# Patient Record
Sex: Female | Born: 1995 | Race: White | Hispanic: No | Marital: Single | Smoking: Never smoker
Health system: Southern US, Community
[De-identification: ages and names within clinical notes are randomized; demographics above are authoritative.]

## PROBLEM LIST (undated history)

## (undated) DIAGNOSIS — B2790 Infectious mononucleosis, unspecified without complication: Secondary | ICD-10-CM

## (undated) DIAGNOSIS — H53143 Visual discomfort, bilateral: Secondary | ICD-10-CM

## (undated) HISTORY — DX: Visual discomfort, bilateral: H53.143

---
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
## 2013-01-10 DIAGNOSIS — B2790 Infectious mononucleosis, unspecified without complication: Secondary | ICD-10-CM | POA: Insufficient documentation

## 2013-01-10 DIAGNOSIS — E559 Vitamin D deficiency, unspecified: Secondary | ICD-10-CM | POA: Insufficient documentation

## 2013-01-10 DIAGNOSIS — E7212 Methylenetetrahydrofolate reductase deficiency: Secondary | ICD-10-CM | POA: Insufficient documentation

## 2013-01-24 LAB — HEMOGLOBIN A1C: Hgb A1c MFr Bld: 4.8 % (ref 4.0–6.0)

## 2013-01-24 LAB — BASIC METABOLIC PANEL: Glucose: 80 mg/dL

## 2013-03-29 ENCOUNTER — Encounter (HOSPITAL_COMMUNITY): Payer: Self-pay | Admitting: *Deleted

## 2013-03-29 ENCOUNTER — Emergency Department (HOSPITAL_COMMUNITY)
Admission: EM | Admit: 2013-03-29 | Discharge: 2013-03-29 | Disposition: A | Discharge status: Home / Self Care | Payer: Managed Care, Other (non HMO) | Attending: Emergency Medicine | Admitting: Emergency Medicine

## 2013-03-29 ENCOUNTER — Emergency Department (HOSPITAL_COMMUNITY): Payer: Managed Care, Other (non HMO)

## 2013-03-29 DIAGNOSIS — Z3202 Encounter for pregnancy test, result negative: Secondary | ICD-10-CM | POA: Insufficient documentation

## 2013-03-29 DIAGNOSIS — R5381 Other malaise: Secondary | ICD-10-CM | POA: Insufficient documentation

## 2013-03-29 DIAGNOSIS — Z79899 Other long term (current) drug therapy: Secondary | ICD-10-CM | POA: Insufficient documentation

## 2013-03-29 DIAGNOSIS — R112 Nausea with vomiting, unspecified: Secondary | ICD-10-CM | POA: Insufficient documentation

## 2013-03-29 DIAGNOSIS — E039 Hypothyroidism, unspecified: Secondary | ICD-10-CM | POA: Insufficient documentation

## 2013-03-29 DIAGNOSIS — Z8619 Personal history of other infectious and parasitic diseases: Secondary | ICD-10-CM | POA: Insufficient documentation

## 2013-03-29 DIAGNOSIS — J029 Acute pharyngitis, unspecified: Secondary | ICD-10-CM | POA: Insufficient documentation

## 2013-03-29 DIAGNOSIS — R509 Fever, unspecified: Secondary | ICD-10-CM | POA: Insufficient documentation

## 2013-03-29 DIAGNOSIS — R000 Tachycardia, unspecified: Secondary | ICD-10-CM | POA: Insufficient documentation

## 2013-03-29 HISTORY — DX: Infectious mononucleosis, unspecified without complication: B27.90

## 2013-03-29 LAB — BASIC METABOLIC PANEL
Chloride: 101 mEq/L (ref 96–112)
Creatinine, Ser: 0.65 mg/dL (ref 0.50–1.10)
GFR calc Af Amer: >90 mL/min (ref >90)
GFR calc non Af Amer: >90 mL/min (ref >90)
Sodium: 136 mEq/L (ref 135–145)

## 2013-03-29 LAB — CBC WITH DIFFERENTIAL/PLATELET
Eosinophils Absolute: 0 10*3/uL (ref 0.0–0.7)
Eosinophils Relative: 0 % (ref 0–5)
Hemoglobin: 12.6 g/dL (ref 12.0–15.0)
Lymphs Abs: 0.6 10*3/uL — ABNORMAL LOW (ref 0.7–4.0)
MCH: 31.4 pg (ref 26.0–34.0)
MCHC: 35.3 g/dL (ref 30.0–36.0)
MCV: 89 fL (ref 78.0–100.0)
Neutrophils Relative %: 90 % — ABNORMAL HIGH (ref 43–77)
RBC: 4.01 MIL/uL (ref 3.87–5.11)
RDW: 12.9 % (ref 11.5–15.5)

## 2013-03-29 LAB — WET PREP, GENITAL: Clue Cells Wet Prep HPF POC: NONE SEEN

## 2013-03-29 MED ORDER — ONDANSETRON HCL 4 MG/2ML IJ SOLN
4.0000 mg | Freq: Once | INTRAMUSCULAR | Status: AC
  Administered 2013-03-29: 4 mg via INTRAVENOUS
  Filled 2013-03-29: qty 2

## 2013-03-29 MED ORDER — ONDANSETRON 4 MG PO TBDP: 4.0000 mg | ORAL_TABLET | Freq: Four times a day (QID) | ORAL | Status: DC | PRN

## 2013-03-29 MED ORDER — SODIUM CHLORIDE 0.9 % IV BOLUS (SEPSIS)
1000.0000 mL | Freq: Once | INTRAVENOUS | Status: AC
  Administered 2013-03-29: 1000 mL via INTRAVENOUS

## 2013-03-29 MED ORDER — LACTATED RINGERS IV BOLUS (SEPSIS)
1000.0000 mL | Freq: Once | INTRAVENOUS | Status: AC
  Administered 2013-03-29: 1000 mL via INTRAVENOUS

## 2013-03-29 MED ORDER — ACETAMINOPHEN 325 MG PO TABS
ORAL_TABLET | ORAL | Status: AC
  Filled 2013-03-29: qty 2

## 2013-03-29 MED ORDER — PREDNISONE 20 MG PO TABS
30.0000 mg | ORAL_TABLET | Freq: Once | ORAL | Status: AC
  Administered 2013-03-29: 30 mg via ORAL
  Filled 2013-03-29: qty 2

## 2013-03-29 MED ORDER — ACETAMINOPHEN 325 MG PO TABS
650.0000 mg | ORAL_TABLET | Freq: Once | ORAL | Status: AC
Start: 2013-03-29 — End: 2013-03-29
  Administered 2013-03-29: 650 mg via ORAL

## 2013-03-29 MED ORDER — PREDNISONE 20 MG PO TABS: 20.0000 mg | ORAL_TABLET | Freq: Every day | ORAL | Status: DC

## 2013-03-29 MED ORDER — KETOROLAC TROMETHAMINE 30 MG/ML IJ SOLN
15.0000 mg | Freq: Once | INTRAMUSCULAR | Status: AC
  Administered 2013-03-29: 15 mg via INTRAVENOUS
  Filled 2013-03-29: qty 1

## 2013-03-29 NOTE — ED Notes (Signed)
Pt states she felt as if she could not breath well, feels like throat is swollen, producing mucus, tonight increased symptoms, Mono test obtained 03/28/13

## 2013-03-29 NOTE — ED Notes (Signed)
Patient transported to CT 

## 2013-03-29 NOTE — ED Provider Notes (Signed)
History     CSN: [id number]  Arrival date & time 03/29/13  0133   First MD Initiated Contact with Patient 03/29/13 0303      Chief Complaint  Patient presents with  . Sore Throat  . Allergic Reaction    (Consider location/radiation/quality/duration/timing/severity/associated sxs/prior treatment) HPI Destiny Sanders is a 17 y.o. female history of diagnosis of mononucleosis last October.  She's had 10 days of not feeling well, this is described by her mother as "a crash" this is recurrently happened this consisted of swollen painful glands in her neck, occasional nausea and vomiting and occasional fever. She's had continuous fatigue since October and her diagnosis of mono. Recent laboratory testing has revealed hypothyroidism. Patient says she felt like her throat was swelling and making it difficult to breathe earlier but she took some Benadryl and it got much better. She also felt her heart was racing earlier but that has gotten better as well. Has no history of venous thromboembolic disease, no history of hemoptysis, has not been immobilized had recent long bone fractures however is on oral contraceptives.  Patient has had some lower abdominal cramping which she attributes to her menstrual cycle.   Past Medical History  Diagnosis Date  . Mononucleosis     History reviewed. No pertinent past surgical history.  No family history on file.  History  Substance Use Topics  . Smoking status: Never Smoker   . Smokeless tobacco: Not on file  . Alcohol Use: No    OB History   Grav Para Term Preterm Abortions TAB SAB Ect Mult Living                  Review of Systems  Allergies  Review of patient's allergies indicates no known allergies.  Home Medications   Current Outpatient Rx  Name  Route  Sig  Dispense  Refill  . Cholecalciferol 25000 UNITS CAPS   Oral   Take 1 capsule by mouth 4 (four) times a week.         . Norethin Ace-Eth Estrad-FE (MINASTRIN 24 FE PO)    Oral   Take 1 tablet by mouth daily.         Marland Kitchen OVER THE COUNTER MEDICATION   Oral   Take 1 capsule by mouth daily. Iron Response         . OVER THE COUNTER MEDICATION      OTC. UltraFlora Spectrum         . OVER THE COUNTER MEDICATION   Oral   Take 1 capsule by mouth daily. DHEA 5mg  Micronized         . PRESCRIPTION MEDICATION   Oral   Take 1 capsule by mouth daily. MMB. 5mg /2.5mg /50mg .           BP 123/70  Pulse 134  Temp(Src) 98.6 F (37 C) (Oral)  Resp 19  Ht 5\' 1"  (1.549 m)  Wt 110 lb (49.896 kg)  BMI 20.8 kg/m2  SpO2 100%  LMP 03/26/2013  Physical Exam  Nursing notes reviewed.  Electronic medical record reviewed. VITAL SIGNS:   Filed Vitals:   03/29/13 0139 03/29/13 0511 03/29/13 0630 03/29/13 0653  BP: 123/70 111/58    Pulse: 134 131 121 111  Temp: 98.6 F (37 C) 101.2 F (38.4 C) 100.3 F (37.9 C)   TempSrc: Oral Oral Oral   Resp: 19 32 22 23  Height: 5\' 1"  (1.549 m)     Weight: 110 lb (49.896 kg)  SpO2: 100% 98% 98% 97%   CONSTITUTIONAL: Awake, oriented, appears non-toxic HENT: Atraumatic, normocephalic, oral mucosa pink and moist, airway patent. Bilateral white patches approximately 1 cm in diameter inferior to each pharyngeal tonsil. No erythema in the pharynx. Nares patent without drainage. External ears normal, TMs are normal bilaterally EYES: Conjunctiva clear, EOMI, PERRLA NECK: Trachea midline, non-tender, supple CARDIOVASCULAR: Tachycardic heart rate, Normal rhythm, No murmurs, rubs, gallops PULMONARY/CHEST: Clear to auscultation, no rhonchi, wheezes, or rales. Symmetrical breath sounds. Non-tender. ABDOMINAL: Non-distended, soft, non-tender - no rebound or guarding.  BS normal. NEUROLOGIC: Non-focal, moving all four extremities, no gross sensory or motor deficits. EXTREMITIES: No clubbing, cyanosis, or edema SKIN: Warm, Dry, No erythema, No rash  ED Course  Procedures (including critical care time)  Labs Reviewed  WET  PREP, GENITAL - Abnormal; Notable for the following:    WBC, Wet Prep HPF POC FEW (*)    All other components within normal limits  BASIC METABOLIC PANEL - Abnormal; Notable for the following:    Potassium 3.4 (*)    Glucose, Bld 118 (*)    All other components within normal limits  CBC WITH DIFFERENTIAL - Abnormal; Notable for the following:    WBC 13.1 (*)    HCT 35.7 (*)    Neutrophils Relative 90 (*)    Neutro Abs 11.8 (*)    Lymphocytes Relative 4 (*)    Lymphs Abs 0.6 (*)    All other components within normal limits  POCT PREGNANCY, URINE   Dg Neck Soft Tissue  03/29/2013  *RADIOLOGY REPORT*  Clinical Data: Sore throat  NECK SOFT TISSUES - 1+ VIEW  Comparison: None.  Findings: Prevertebral soft tissue normal thickness. No prevertebral gas.  Mild adenoidal hypertrophy.  Airway patent. Epiglottis normal.  Visualized osseous structures are normal. Upper lungs clear.  IMPRESSION: Mild adenoidal hypertrophy. Otherwise, no acute abnormality identified involving the soft tissues of the neck.   Original Report Authenticated By: Jearld Lesch, M.D.      1. Sore throat   2. History of mononucleosis   3. Fever   4. Tachycardia       MDM  Destiny Sanders is a 17 y.o. female  presents with sensation that she could not breathe well, she says that her throat felt swollen these symptoms resolved with Benadryl. Patient has had history of mono and says she's not gotten over it.  Airway is patent, patient does have some small patches in the throat if they're not suggestive of streptococcal infection, swab these areas which revealed white blood cells.  Patient is tachycardic and febrile, will receive antipyretics and fluids.  Also start the patient on steroids as she may be having recurrent Mono- which although it seems rare seems consistent with her consistent presentation since last October. Patient has had multiple courses of antibiotics for the same presentation because they thought she  had strep, again there is no erythema in the throat, tonsils are of normal size no swelling in the throat, no tender cervical lymphadenopathy, not consistent with streptococcal pharyngitis.  Patient's tachycardia and fever retrieved, she's feeling much better. Keep her on steroids per couple more days to reduce any kind of adenoidal swelling may be as a result of a viral infection.  Patient's concern about feeling nauseous, we'll give her some Zofran in case the nausea returns. Refer her to infectious disease to discuss possible recurrence of mononucleosis since the patient has already seen ENT and her family physician.  I explained the diagnosis  and have given explicit precautions to return to the ER including any fever, difficulty breathing, abnormal breathing sounds or any other new or worsening symptoms. The patient understands and accepts the medical plan as it's been dictated and I have answered parents and the patient's questions. Discharge instructions concerning home care including using ibuprofen and Tylenol for fever control and prescriptions have been given.  The patient is STABLE and is discharged to home in good condition.   Jones Skene, MD 03/29/13 [phone]

## 2013-04-19 ENCOUNTER — Encounter: Payer: Self-pay | Admitting: Infectious Disease

## 2013-04-19 ENCOUNTER — Encounter: Payer: Self-pay | Admitting: *Deleted

## 2013-04-19 ENCOUNTER — Telehealth: Payer: Self-pay | Admitting: *Deleted

## 2013-04-19 ENCOUNTER — Ambulatory Visit (INDEPENDENT_AMBULATORY_CARE_PROVIDER_SITE_OTHER): Payer: Managed Care, Other (non HMO) | Admitting: Infectious Disease

## 2013-04-19 VITALS — BP 103/68 | HR 106 | Temp 98.4°F | Ht 60.0 in | Wt 107.0 lb

## 2013-04-19 DIAGNOSIS — R790 Abnormal level of blood mineral: Secondary | ICD-10-CM | POA: Insufficient documentation

## 2013-04-19 DIAGNOSIS — B259 Cytomegaloviral disease, unspecified: Secondary | ICD-10-CM

## 2013-04-19 DIAGNOSIS — E559 Vitamin D deficiency, unspecified: Secondary | ICD-10-CM

## 2013-04-19 DIAGNOSIS — R161 Splenomegaly, not elsewhere classified: Secondary | ICD-10-CM

## 2013-04-19 DIAGNOSIS — R590 Localized enlarged lymph nodes: Secondary | ICD-10-CM | POA: Insufficient documentation

## 2013-04-19 DIAGNOSIS — R599 Enlarged lymph nodes, unspecified: Secondary | ICD-10-CM

## 2013-04-19 DIAGNOSIS — E7212 Methylenetetrahydrofolate reductase deficiency: Secondary | ICD-10-CM

## 2013-04-19 DIAGNOSIS — K759 Inflammatory liver disease, unspecified: Secondary | ICD-10-CM

## 2013-04-19 DIAGNOSIS — R509 Fever, unspecified: Secondary | ICD-10-CM

## 2013-04-19 DIAGNOSIS — B2790 Infectious mononucleosis, unspecified without complication: Secondary | ICD-10-CM

## 2013-04-19 DIAGNOSIS — Z79899 Other long term (current) drug therapy: Secondary | ICD-10-CM

## 2013-04-19 LAB — LACTATE DEHYDROGENASE: LDH: 119 U/L (ref 94–250)

## 2013-04-19 LAB — CBC WITH DIFFERENTIAL/PLATELET
Eosinophils Absolute: 0 10*3/uL (ref 0.0–0.7)
Hemoglobin: 13.3 g/dL (ref 12.0–15.0)
Lymphs Abs: 0.9 10*3/uL (ref 0.7–4.0)
Monocytes Relative: 9 % (ref 3–12)
Neutro Abs: 2.3 10*3/uL (ref 1.7–7.7)
Neutrophils Relative %: 64 % (ref 43–77)
Platelets: 222 10*3/uL (ref 150–400)
RBC: 4.22 MIL/uL (ref 3.87–5.11)
WBC: 3.6 10*3/uL — ABNORMAL LOW (ref 4.0–10.5)

## 2013-04-19 LAB — COMPLETE METABOLIC PANEL WITH GFR
ALT: 12 U/L (ref 0–35)
Albumin: 4.5 g/dL (ref 3.5–5.2)
Alkaline Phosphatase: 52 U/L (ref 39–117)
CO2: 26 mEq/L (ref 19–32)
GFR, Est Non African American: >89 mL/min
Glucose, Bld: 87 mg/dL (ref 70–99)
Potassium: 4.5 mEq/L (ref 3.5–5.3)
Sodium: 139 mEq/L (ref 135–145)
Total Bilirubin: 0.4 mg/dL (ref 0.3–1.2)
Total Protein: 6.7 g/dL (ref 6.0–8.3)

## 2013-04-19 LAB — ANGIOTENSIN CONVERTING ENZYME: Angiotensin-Converting Enzyme: 27 U/L (ref 8–52)

## 2013-04-19 LAB — CK: Total CK: 30 U/L (ref 7–177)

## 2013-04-19 NOTE — Addendum Note (Signed)
Addended by: Mariea Clonts D on: 04/19/2013 02:02 PM   Modules accepted: Orders

## 2013-04-19 NOTE — Telephone Encounter (Signed)
Initiated prior authorization for Chest, Abdominal/pelvic CT with contrast (CPT codes 40981, [id number] respectively).  Initially denied, sent to nurse line for review.  Should hear results of review by end of business Monday, 04/23/13 by fax or phone.  May check status of review on [email or web address]    Case number is [id number].

## 2013-04-19 NOTE — Progress Notes (Signed)
[facility]   Reason for Consult:FUO, lymphadenopathy, and malaise Referring Physician: Patrecia Pace, CMA   Reason for consult:   HPI: 17 year old Caucasian lady with NO significant PMHX who presents with symptoms of cervical lymphadenopathy malaise, subjective fever with onset in August, 2013. At that time she had EBV VcApsid IGG POSITIVE, VCAG IgM negative. Her CMV IGM was positive, CMV IgG was negative. Her HIV EIA was negative as was her RF, ANA, RMSF and Lyme titers. She has had mx negative rapid strep tests and cultures. She has been treated with mx rounds of abx without help. She had developed in addition to above ssx white lesion in posterior OP and was seen by ENT who could not identify cause of these lesions. She had CT neck last Spring with similar ssx that was per report of pt and mom unrevealing.   She was seen in ED at [facility] on April 24th and has been seen at ED in [place] and nothing revealing found on labs. In April  cervcal LA was sever and she felt like she was having her throat close off. She took benadryl with some relief, she ahad abd pain that she thought was due to menses. She   was only imaged with plain films. She had wet prep done as which failed to show infection. She had temp of 101.2 and was tachycardic.. ED rx steroids which improved her ssx but she was again seen shortly thereafter in [place], [place] ED.  She visits her father in the [place] at times and grew up there. NO other sig travel. Her mother, grandmotther and aunt all suffered from breast cancer. There is no hx of CTD or malignancy otherwise. She is sexually active with boyfriend.   We  spent greater than 60 minutes with the patient including greater than 50% of time in face to face counsel of the patient and in coordination of their care including mx discussions with insurance provider to prior auth imaging.  ROS: as in HPI otherwise 12 point ros is  negative   Social History:  reports that she has never smoked. She does not have any smokeless tobacco history on file. She reports that she does not drink alcohol or use illicit drugs.  No family history on file.  As in HPI and primary teams notes otherwise 12 point review of systems is negative  Blood pressure 103/68, pulse 106, temperature 98.4 F (36.9 C), temperature source Oral, height 5' (1.524 m), weight 107 lb (48.535 kg), last menstrual period 03/26/2013.  General: Alert and awake, oriented x3, not in any acute distress. HEENT: anicteric sclera, pupils reactive to light and accommodation, EOMI, oropharynx with stringy exudate posteriorly CVS regular rate, normal r,  no murmur rubs or gallops Chest: clear to auscultation bilaterally, no wheezing, rales or rhonchi Abdomen: soft  Slight tender to palpation in LLQ,  nondistended, normal bowel sounds, liver normal, spleen slightly enlarged and tender Extremities: no  clubbing or edema noted bilaterally Skin: no rashes Neuro: nonfocal, strength and sensation intact    Impression/Recommendation  17 year old with months of malaise, fluctuating cervical LA, abdominal pain, subjective fevers, malaise, measured fever in ED recently with workup in August showing CMV igM positive and EBV IgG positive, otherwise workup has been unremarkable.   #1 FUO:  -will check HIV RNA, hepatitis panel, ESR, CRP, LDH, ANCA, ACE level  --I will check CT chest abd and pelvis and if this is  unrevealing repeat CT of the neck soft tissue  --I am sending her throat swab for Gonorrhea culture and repeat throate culture  --repeat CSD titers  # 2 Cervical LA: repeat CSD titers, above labs, Could this be [place]? Will repeat CT Neck pref when LN enlarged  #3 Splenomegaly: could have been due to past EBV, CMV but will image to exclude more nefarious process   Thank you so much for this interesting consult  [facility] [phone] (pager) [phone] (office) 04/19/2013, 12:14 PM  Paulette Blanch [place] 04/19/2013, 12:14 PM

## 2013-04-20 LAB — HEPATITIS PANEL, ACUTE
HCV Ab: NEGATIVE
Hep A IgM: NEGATIVE
Hep B C IgM: NEGATIVE
Hepatitis B Surface Ag: NEGATIVE

## 2013-04-20 LAB — STREP A DNA PROBE: GASP: NEGATIVE

## 2013-04-21 LAB — BARTONELLA ANTIBODY PANEL

## 2013-04-23 LAB — QUANTIFERON TB GOLD ASSAY (BLOOD)
Interferon Gamma Release Assay: NEGATIVE
Quantiferon Tb Ag Minus Nil Value: 0 IU/mL

## 2013-04-23 LAB — ANCA SCREEN W REFLEX TITER: Atypical p-ANCA Screen: NEGATIVE

## 2013-04-23 LAB — ANCA TITERS

## 2013-04-25 ENCOUNTER — Telehealth: Payer: Self-pay | Admitting: *Deleted

## 2013-04-25 ENCOUNTER — Telehealth: Payer: Self-pay | Admitting: Infectious Disease

## 2013-04-25 DIAGNOSIS — R509 Fever, unspecified: Secondary | ICD-10-CM

## 2013-04-25 NOTE — Telephone Encounter (Signed)
CT prior authorization denied.  Per patient's insurance coverage, she must first have a chest xray and abdominal ultrasound study before they will approve the chest and abdominal CTs.  Patient's mother notified.  [facility] is the insurance company's preferred provider, CXR and abdominal US do not require prior authorization.  Pt and her family are currently out of town.  Pt's mother given the phone number to [facility] to schedule the CXR and [place] at their convenience.  Chest and abdominal CT will be revisited once the other studies are complete. Andree Coss, RN

## 2013-04-25 NOTE — Telephone Encounter (Signed)
pts insurance denied CT abd and chest withotu prior CXR and [place] abd

## 2013-04-27 ENCOUNTER — Telehealth: Payer: Self-pay | Admitting: *Deleted

## 2013-04-27 NOTE — Telephone Encounter (Signed)
Patient's Mom called back wanting Dr. Daiva Eves to call [employer] and request a peer to peer for the CT Scan to bipass having the ultrasound down.  Wendall Mola

## 2013-04-27 NOTE — Telephone Encounter (Signed)
That's fine

## 2013-04-27 NOTE — Telephone Encounter (Signed)
Patient's Mom called to let Destiny Duster, RN know that she had a past CT of the head done by Dr. Ledell Peoples at [facility]. She will try to obtain records and have them sent to this clinic. Destiny Sanders

## 2013-04-27 NOTE — Telephone Encounter (Signed)
Pt's mother would like you to do a Peer to Peer review to bypass the chest xray and abdominal ultrasound and go directly to the CTs. Should I set this up on Tuesday for you? Marcelino Duster

## 2013-05-02 ENCOUNTER — Telehealth: Payer: Self-pay | Admitting: *Deleted

## 2013-05-02 ENCOUNTER — Ambulatory Visit
Admission: RE | Admit: 2013-05-02 | Discharge: 2013-05-02 | Disposition: A | Discharge status: Home / Self Care | Payer: Managed Care, Other (non HMO) | Source: Ambulatory Visit | Attending: Infectious Disease | Admitting: Infectious Disease

## 2013-05-02 DIAGNOSIS — R509 Fever, unspecified: Secondary | ICD-10-CM

## 2013-05-02 NOTE — Telephone Encounter (Signed)
Began second PA for chest and abdominal CT scans.  Case #[id number]. Should have answer faxed to [place] by 05/04/13. Andree Coss, RN

## 2013-05-03 ENCOUNTER — Other Ambulatory Visit: Payer: Managed Care, Other (non HMO)

## 2013-05-04 ENCOUNTER — Telehealth: Payer: Self-pay | Admitting: *Deleted

## 2013-05-04 NOTE — Telephone Encounter (Signed)
Thanks

## 2013-05-04 NOTE — Telephone Encounter (Signed)
PA received for CT Chest and Abdomen with contrast.  Case #[id number].  GU#[id number]  CPT codes 95638 CT ABD/PEL c CONTRAST and 71260 CT CHEST c CONTRAST to be completed at [facility]. Authorization expires July 27,2014. Patient's mother notified.  She will call to make an appointment for early next week. Andree Coss, RN

## 2013-05-08 ENCOUNTER — Ambulatory Visit
Admission: RE | Admit: 2013-05-08 | Discharge: 2013-05-08 | Disposition: A | Discharge status: Home / Self Care | Payer: Managed Care, Other (non HMO) | Source: Ambulatory Visit | Attending: Infectious Disease | Admitting: Infectious Disease

## 2013-05-08 DIAGNOSIS — R509 Fever, unspecified: Secondary | ICD-10-CM

## 2013-05-08 MED ORDER — IOHEXOL 300 MG/ML  SOLN
100.0000 mL | Freq: Once | INTRAMUSCULAR | Status: AC | PRN
  Administered 2013-05-08: 100 mL via INTRAVENOUS

## 2013-05-16 ENCOUNTER — Encounter: Payer: Self-pay | Admitting: Infectious Disease

## 2013-05-16 ENCOUNTER — Ambulatory Visit (INDEPENDENT_AMBULATORY_CARE_PROVIDER_SITE_OTHER): Payer: Managed Care, Other (non HMO) | Admitting: Infectious Disease

## 2013-05-16 VITALS — BP 106/67 | HR 92 | Temp 98.9°F | Ht 61.0 in | Wt 109.0 lb

## 2013-05-16 DIAGNOSIS — Z8669 Personal history of other diseases of the nervous system and sense organs: Secondary | ICD-10-CM | POA: Insufficient documentation

## 2013-05-16 DIAGNOSIS — R509 Fever, unspecified: Secondary | ICD-10-CM

## 2013-05-16 DIAGNOSIS — R5383 Other fatigue: Secondary | ICD-10-CM

## 2013-05-16 DIAGNOSIS — H538 Other visual disturbances: Secondary | ICD-10-CM

## 2013-05-16 DIAGNOSIS — R5382 Chronic fatigue, unspecified: Secondary | ICD-10-CM

## 2013-05-16 NOTE — Progress Notes (Signed)
[facility]    HPI  17 year old Caucasian lady with NO significant PMHX who presents with symptoms of cervical lymphadenopathy malaise, subjective fever with onset in August, 2013. At that time she had EBV VcApsid IGG POSITIVE, VCAG IgM negative. Her CMV IGM was positive, CMV IgG was negative. Her HIV EIA was negative as was her RF, ANA, RMSF and Lyme titers. She has had mx negative rapid strep tests and cultures. She has been treated with mx rounds of abx without help. She had developed in addition to above ssx white lesion in posterior OP and was seen by ENT who could not identify cause of these lesions. She had CT neck last Spring with similar ssx that was per report of pt and mom unrevealing.   She was seen in ED at [facility] on April 24th and has been seen at ED in [place] and nothing revealing found on labs. In April  cervcal LA was sever and she felt like she was having her throat close off. She took benadryl with some relief, she ahad abd pain that she thought was due to menses. She   was only imaged with plain films. She had wet prep done as which failed to show infection. She had temp of 101.2 and was tachycardic.. ED rx steroids which improved her ssx but she was again seen shortly thereafter in [place], [place] ED.  She visits her father in the [place] at times and grew up there. NO other sig travel. Her mother, grandmotther and aunt all suffered from breast cancer. There is no hx of CTD or malignancy otherwise. She is sexually active with boyfriend.   Did an extensive battery of tests here in our clinic including, repeat group a strep culture, gc culture which were negative. IV RNA which was negative Bartonella antibodies. QUANTIFERON GOLD, which were all negative. ACE, LDH, ESR, CRP Hepatitis panel all negative or normal. She did have slightly elevated ANCA titer at 1:40 of unclear significance.  Also completed radiographic workup for her "fever of  unknown origin "with CT of the chest abdomen and pelvis which failed to show any intrathoracic or intra-abdominal pathology.  Since then she continues to have fatigue and low energy and difficulty exercising at all without becoming profoundly exhausted. She has not had any measured temperatures. She has not had recurrence of her cervical lymphadenopathy which happens at times.  I feel very strongly that we have ruled out any Infectious, Malignant or CTD cause for her symptoms of malaise, fatigue, subjective and objective fevers. Her weight gain is also reassuring that this is NOT a sinister process.  I think this i much more likely a nation of depression and chronic fatigue syndrome.  Note the mother indicates that her daughter has been started on an organic form of thyroid hormone based on thyroid function tests. The thyroid function-he showed me showed a normal T3 and normal T4. I therefore don't think she should be on thyroid replacement medicines of any kind.  She has had some photophobia and headache suggestive of possible migrainous headaches and would like referral to a neurologist which we are going to accommodate.  I spent greater than 45 minutes with the patient and her mother  including greater than 50% of time in face to face counsel of the patient re FUO and her symptom complex and in coordination of their care.     We  spent greater than 60 minutes with the  patient including greater than 50% of time in face to face counsel of the patient and in coordination of their care including mx discussions with insurance provider to prior auth imaging.  Review of Systems  Constitutional: Positive for activity change and fatigue. Negative for fever, chills, diaphoresis, appetite change and unexpected weight change.  HENT: Negative for congestion, sore throat, rhinorrhea, sneezing, trouble swallowing and sinus pressure.   Eyes: Negative for photophobia and visual disturbance.  Respiratory:  Negative for cough, chest tightness, shortness of breath, wheezing and stridor.   Cardiovascular: Negative for chest pain, palpitations and leg swelling.  Gastrointestinal: Negative for nausea, vomiting, abdominal pain, diarrhea, constipation, blood in stool, abdominal distention and anal bleeding.  Genitourinary: Negative for dysuria, hematuria, flank pain and difficulty urinating.  Musculoskeletal: Negative for myalgias, back pain, joint swelling, arthralgias and gait problem.  Skin: Negative for color change, pallor, rash and wound.  Neurological: Positive for headaches. Negative for dizziness, tremors, weakness and light-headedness.  Hematological: Negative for adenopathy. Does not bruise/bleed easily.  Psychiatric/Behavioral: Negative for behavioral problems, confusion, sleep disturbance, dysphoric mood, decreased concentration and agitation.    Physical Exam  Constitutional: She is oriented to person, place, and time. She appears well-developed and well-nourished. No distress.  HENT:  Head: Normocephalic and atraumatic.  Mouth/Throat: Oropharynx is clear and moist. No oropharyngeal exudate.  Eyes: Conjunctivae and EOM are normal. No scleral icterus.  Neck: Normal range of motion. Neck supple. No JVD present.  Cardiovascular: Normal rate and regular rhythm.   Pulmonary/Chest: Effort normal and breath sounds normal. No respiratory distress. She has no wheezes.  Abdominal: She exhibits no distension. There is no tenderness.  Musculoskeletal: She exhibits no edema and no tenderness.  Lymphadenopathy:    She has no cervical adenopathy.  Neurological: She is alert and oriented to person, place, and time. She exhibits normal muscle tone. Coordination normal.  Skin: Skin is warm and dry. She is not diaphoretic. No erythema. No pallor.  Psychiatric: Her behavior is normal. Judgment and thought content normal.  Slightly flat affect     17 year old with months of malaise, fluctuating  cervical LA, abdominal pain, subjective fevers, malaise, measured fever in ED recently with workup in August showing CMV igM positive and EBV IgG positive, otherwise workup has been largely unremarkable.   #1 FUO:  We have done an extensive series of blood tests here as outlined above as well as CT chest, abd pelvis (and she had prior CT neck) and ALL of this has ruled out ID , malignant or CTD cause for symptoms. More likely this is due to depression, chronic fatigue possibly a post mono malaise.  --would recommend rx for depression and CHronic fatigue  # 2 Cervical LA: LN not enlarged and would NOT do repeat CT UNLESS THEY STAY enlarged  #3 headaches with photophobia: will refer to NEurology for rx of migraine HA< perhaps a tricyclic could be used as prophylaxis  #4 Blurry vision esp with headaches, going to ophthalmologist

## 2013-05-21 ENCOUNTER — Ambulatory Visit: Payer: Managed Care, Other (non HMO) | Admitting: Infectious Disease

## 2013-05-22 ENCOUNTER — Telehealth: Payer: Self-pay | Admitting: *Deleted

## 2013-05-22 NOTE — Telephone Encounter (Signed)
Referral faxed to Selena Batten at [facility], she will contact the patient once the appt has been scheduled. She will also fax the appointment info to this clinic. Wendall Mola

## 2013-06-25 ENCOUNTER — Encounter: Payer: Self-pay | Admitting: Diagnostic Neuroimaging

## 2013-06-25 ENCOUNTER — Ambulatory Visit (INDEPENDENT_AMBULATORY_CARE_PROVIDER_SITE_OTHER): Payer: Managed Care, Other (non HMO) | Admitting: Diagnostic Neuroimaging

## 2013-06-25 DIAGNOSIS — H53143 Visual discomfort, bilateral: Secondary | ICD-10-CM

## 2013-06-25 DIAGNOSIS — R509 Fever, unspecified: Secondary | ICD-10-CM

## 2013-06-25 DIAGNOSIS — H53149 Visual discomfort, unspecified: Secondary | ICD-10-CM

## 2013-06-25 DIAGNOSIS — R51 Headache: Secondary | ICD-10-CM

## 2013-06-25 NOTE — Progress Notes (Signed)
[facility]  PATIENT: Elpidia Karn DOB: 03-11-1996  REFERRING CLINICIAN: Daiva Eves HISTORY FROM: patient and mother REASON FOR VISIT: new consult   HISTORICAL  CHIEF COMPLAINT:  No chief complaint on file.   HISTORY OF PRESENT ILLNESS:   17 year old right-handed female here for evaluation of headaches and photophobia.  August 2013, patient was having unexplained fevers, swollen lymph nodes in the neck, diagnosed with possible "mono".  By January 2014, she was having sensitivity to light, mild frontal headaches. No phonophobia. No unilateral headaches. No throbbing sensation. No focal numbness or weakness.  Over time her symptoms have gradually improved. She seen multiple eye doctors, and now has referral to retina specialist.  There is family history of migraine headaches in patient's maternal uncles. Patient herself has never had migraine headaches before this. She's taken Tylenol as needed for pain control.  Denies overt depression or anxiety. No sleep problems.    REVIEW OF SYSTEMS: Full 14 system review of systems performed and notable only for headache blurred vision fevers chills fatigue anemia swollen lymph nodes confusion headache decreased energy.  ALLERGIES: No Known Allergies  HOME MEDICATIONS: Outpatient Prescriptions Prior to Visit  Medication Sig Dispense Refill  . Cholecalciferol 25000 UNITS CAPS Take 1 capsule by mouth 4 (four) times a week.      . ondansetron (ZOFRAN ODT) 4 MG disintegrating tablet Take 1 tablet (4 mg total) by mouth every 6 (six) hours as needed for nausea.  10 tablet  0  . OVER THE COUNTER MEDICATION Take 1 capsule by mouth daily. Iron Response      . OVER THE COUNTER MEDICATION OTC. UltraFlora Spectrum      . OVER THE COUNTER MEDICATION Take 1 capsule by mouth daily. DHEA 5mg  Micronized      . PRESCRIPTION MEDICATION Take 1 capsule by mouth daily. MMB. 5mg /2.5mg /50mg .      Marland Kitchen Thyroid (NATURE-THROID PO) Take 0.5 g  by mouth daily.       No facility-administered medications prior to visit.    PAST MEDICAL HISTORY: Past Medical History  Diagnosis Date  . Mononucleosis     PAST SURGICAL HISTORY: No past surgical history on file.  FAMILY HISTORY: No family history on file.  SOCIAL HISTORY:  History   Social History  . Marital Status: Single    Spouse Name: N/A    Number of Children: N/A  . Years of Education: N/A   Occupational History  . Not on file.   Social History Main Topics  . Smoking status: Never Smoker   . Smokeless tobacco: Never Used  . Alcohol Use: No  . Drug Use: No  . Sexually Active: Yes   Other Topics Concern  . Not on file   Social History Narrative  . No narrative on file     PHYSICAL EXAM  There were no vitals filed for this visit.  Not recorded    There is no weight on file to calculate BMI.  GENERAL EXAM: Patient is in no distress  CARDIOVASCULAR: Regular rate and rhythm, no murmurs, no carotid bruits  NEUROLOGIC: MENTAL STATUS: awake, alert, language fluent, comprehension intact, naming intact CRANIAL NERVE: no papilledema on fundoscopic exam, pupils equal and reactive to light, visual fields full to confrontation, extraocular muscles intact, no nystagmus, facial sensation and strength symmetric, uvula midline, shoulder shrug symmetric, tongue midline. MOTOR: normal bulk and tone, full strength in the BUE, BLE SENSORY: normal and symmetric to light touch, pinprick, temperature, vibration COORDINATION: finger-nose-finger, fine finger  movements normal REFLEXES: deep tendon reflexes present and symmetric GAIT/STATION: narrow based gait; able to walk on toes, heels and tandem; romberg is negative   DIAGNOSTIC DATA (LABS, IMAGING, TESTING) - I reviewed patient records, labs, notes, testing and imaging myself where available.  Lab Results  Component Value Date   WBC 3.6* 04/19/2013   HGB 13.3 04/19/2013   HCT 38.9 04/19/2013   MCV 92.2  04/19/2013   PLT 222 04/19/2013      Component Value Date/Time   NA 139 04/19/2013 1118   K 4.5 04/19/2013 1118   CL 105 04/19/2013 1118   CO2 26 04/19/2013 1118   GLUCOSE 87 04/19/2013 1118   BUN 13 04/19/2013 1118   CREATININE 0.66 04/19/2013 1118   CREATININE 0.65 03/29/2013 0200   CALCIUM 9.4 04/19/2013 1118   PROT 6.7 04/19/2013 1118   ALBUMIN 4.5 04/19/2013 1118   AST 14 04/19/2013 1118   ALT 12 04/19/2013 1118   ALKPHOS 52 04/19/2013 1118   BILITOT 0.4 04/19/2013 1118   GFRNONAA >90 03/29/2013 0200   GFRAA >90 03/29/2013 0200   No results found for this basename: CHOL, HDL, LDLCALC, LDLDIRECT, TRIG, CHOLHDL   Lab Results  Component Value Date   HGBA1C 4.8 01/24/2013   No results found for this basename: VITAMINB12   No results found for this basename: TSH      ASSESSMENT AND PLAN  17 y.o. year old female  has a past medical history of Mononucleosis. here with unexplained fevers, history of mononucleosis, with frontal headaches and photophobia. May represent migraine phenomenon. Symptoms are spontaneously improving. I will check MRI of the brain and orbits for further evaluation. I offered migraine prophylaxis medication, but patient declined for now. If symptoms worsen in the future we could consider amitriptyline or topiramate.   Orders Placed This Encounter  Procedures  . MR Brain W Wo Contrast  . MR Orbits WO/W Cm     Meds ordered this encounter  Medications  . Multiple Vitamin (MULTIVITAMIN) capsule    Sig: Take 1 capsule by mouth daily.     Suanne Marker, MD 06/25/2013, 10:34 AM Certified in Neurology, Neurophysiology and Neuroimaging  [facility] [address], [address] [place], [place] [address] [phone]

## 2013-06-25 NOTE — Patient Instructions (Signed)
I will check MRIs. 

## 2013-06-27 ENCOUNTER — Encounter: Payer: Self-pay | Admitting: Diagnostic Neuroimaging

## 2013-06-28 ENCOUNTER — Ambulatory Visit (INDEPENDENT_AMBULATORY_CARE_PROVIDER_SITE_OTHER): Payer: Managed Care, Other (non HMO)

## 2013-06-28 DIAGNOSIS — R51 Headache: Secondary | ICD-10-CM

## 2013-06-28 DIAGNOSIS — H53143 Visual discomfort, bilateral: Secondary | ICD-10-CM

## 2013-06-28 DIAGNOSIS — H53149 Visual discomfort, unspecified: Secondary | ICD-10-CM

## 2013-06-28 DIAGNOSIS — R509 Fever, unspecified: Secondary | ICD-10-CM

## 2013-07-06 ENCOUNTER — Telehealth: Payer: Self-pay | Admitting: Diagnostic Neuroimaging

## 2013-07-13 NOTE — Telephone Encounter (Signed)
Call pt with normal results. -VRP

## 2013-07-16 NOTE — Telephone Encounter (Signed)
Called mother Darl Pikes. No answer. Will try later.

## 2013-07-18 ENCOUNTER — Telehealth: Payer: Self-pay

## 2013-07-18 NOTE — Telephone Encounter (Signed)
I called mom to let her know MRI was normal.  Patient is still having eye and head aches, especially when has elevated temperature, which can be quite often. Still has photophobia. Must wear sunglasses, even when watching TV. Occasional diplopia. She will be seeing Dr. Maple Hudson, Pediatric Retinal Specialist. Appt. in Sept 2014. Patient's lab work and improved with IV therapy. Mom states that all of this began when patient had mono.

## 2013-10-11 ENCOUNTER — Other Ambulatory Visit: Payer: Self-pay

## 2014-01-26 ENCOUNTER — Emergency Department (INDEPENDENT_AMBULATORY_CARE_PROVIDER_SITE_OTHER)
Admission: EM | Admit: 2014-01-26 | Discharge: 2014-01-26 | Disposition: A | Discharge status: Home / Self Care | Payer: Managed Care, Other (non HMO) | Source: Home / Self Care | Attending: Family Medicine | Admitting: Family Medicine

## 2014-01-26 ENCOUNTER — Encounter: Payer: Self-pay | Admitting: Emergency Medicine

## 2014-01-26 DIAGNOSIS — J069 Acute upper respiratory infection, unspecified: Secondary | ICD-10-CM

## 2014-01-26 MED ORDER — BENZONATATE 200 MG PO CAPS: 200.0000 mg | ORAL_CAPSULE | Freq: Every day | ORAL | Status: AC

## 2014-01-26 MED ORDER — AMOXICILLIN 875 MG PO TABS: 875.0000 mg | ORAL_TABLET | Freq: Two times a day (BID) | ORAL | Status: AC

## 2014-01-26 NOTE — ED Notes (Signed)
Pt started congestion 1 week ago with fatigue.  Started constipation 2 days ago. Vomited yesterday.  Cough with yellow mucous, swollen glands, sinus pressure.

## 2014-01-26 NOTE — Discharge Instructions (Signed)
Take plain Mucinex (1200 mg guaifenesin) twice daily for cough and congestion.  May add Sudafed for sinus congestion.   Increase fluid intake, rest. May use Afrin nasal spray (or generic oxymetazoline) twice daily for about 5 days.  Also recommend using saline nasal spray several times daily and saline nasal irrigation (AYR is a common brand).  May use prescription nose spray after Afrin spray and saline rinse. Try warm salt water gargles for sore throat.  Stop all antihistamines for now, and other non-prescription cough/cold preparations. May take Ibuprofen 200mg , 3 tabs every 8 hours with food for headache, etc.   Follow-up with family doctor if not improving 7 to 10 days.

## 2014-01-26 NOTE — ED Provider Notes (Signed)
CSN: [id number]     Arrival date & time 01/26/14  1644 History   First MD Initiated Contact with Patient 01/26/14 1803     Chief Complaint  Patient presents with  . Cough  . Emesis        HPI Comments: Patient is in college and states that she had mono last year.  Approximately one month ago she developed a viral URI that ended about 2 weeks ago.  Six days ago she developed recurrent fatigue, sore throat, sinus congestion, nausea, and cough.  Three days ago she had a fever up to 101+.  She states she has been constipated over the past 2 days.  The history is provided by the patient and a parent.    Past Medical History  Diagnosis Date  . Mononucleosis   . Photophobia of both eyes    History reviewed. No pertinent past surgical history. History reviewed. No pertinent family history. History  Substance Use Topics  . Smoking status: Never Smoker   . Smokeless tobacco: Never Used  . Alcohol Use: No   OB History   Grav Para Term Preterm Abortions TAB SAB Ect Mult Living                 Review of Systems + sore throat + cough No pleuritic pain No wheezing + nasal congestion + post-nasal drainage + sinus pain/pressure No itchy/red eyes No earache No hemoptysis No SOB + fever, + chills + nausea + vomiting, resolved No abdominal pain + diarrhea, resolved; now constipation No urinary symptoms No skin rash + fatigue + myalgias + headache Used OTC meds without relief    Allergies  Review of patient's allergies indicates no known allergies.  Home Medications   Current Outpatient Rx  Name  Route  Sig  Dispense  Refill  . bismuth subsalicylate (PEPTO BISMOL) 262 MG/15ML suspension   Oral   Take 30 mLs by mouth every 6 (six) hours as needed.         Marland Kitchen. guaiFENesin (MUCINEX) 600 MG 12 hr tablet   Oral   Take by mouth 2 (two) times daily.         . pseudoephedrine (SUDAFED) 60 MG tablet   Oral   Take 60 mg by mouth every 4 (four) hours as needed for  congestion.         Marland Kitchen. amoxicillin (AMOXIL) 875 MG tablet   Oral   Take 1 tablet (875 mg total) by mouth 2 (two) times daily.   20 tablet   0   . benzonatate (TESSALON) 200 MG capsule   Oral   Take 1 capsule (200 mg total) by mouth at bedtime. Take as needed for cough   12 capsule   0   . Multiple Vitamin (MULTIVITAMIN) capsule   Oral   Take 1 capsule by mouth daily.          BP 110/75  Pulse 93  Temp(Src) 98.2 F (36.8 C) (Oral)  Ht 5\' 1"  (1.549 m)  Wt 110 lb (49.896 kg)  BMI 20.80 kg/m2  SpO2 98%  LMP 01/11/2014 Physical Exam Nursing notes and Vital Signs reviewed. Appearance:  Patient appears healthy, stated age, and in no acute distress Eyes:  Pupils are equal, round, and reactive to light and accomodation.  Extraocular movement is intact.  Conjunctivae are not inflamed  Ears:  Canals normal.  Tympanic membranes normal.  Nose:  Congested turbinates.  No sinus tenderness.    Pharynx:  Normal; moist mucous  membranes  Neck:  Supple.  ender shotty posterior nodes are palpated bilaterally  Lungs:  Clear to auscultation.  Breath sounds are equal.  Heart:  Regular rate and rhythm without murmurs, rubs, or gallops.  Abdomen:  Nontender without masses or hepatosplenomegaly.  Bowel sounds are present.  No CVA or flank tenderness.  Extremities:  No edema.  No calf tenderness Skin:  No rash present.   ED Course  Procedures  none       MDM   Final diagnoses:  Acute upper respiratory infections of unspecified site, recurrent    With a recent history of two prior URI's will begin amoxicillin.  Prescription written for Benzonatate [facility]) to take at bedtime for night-time cough.  Take plain Mucinex (1200 mg guaifenesin) twice daily for cough and congestion.  May add Sudafed for sinus congestion.   Increase fluid intake, rest. May use Afrin nasal spray (or generic oxymetazoline) twice daily for about 5 days.  Also recommend using saline nasal spray several times daily  and saline nasal irrigation (AYR is a common brand).  May use prescription nose spray after Afrin spray and saline rinse. Try warm salt water gargles for sore throat.  Stop all antihistamines for now, and other non-prescription cough/cold preparations. May take Ibuprofen 200mg , 3 tabs every 8 hours with food for headache, etc.   Follow-up with family doctor if not improving 7 to 10 days.     Lattie Haw, MD 01/27/14 1239

## 2014-09-22 IMAGING — US US ABDOMEN COMPLETE
1 series · 14 of 25 positions shown · non-contrast
Comparison: None.

CLINICAL DATA: Fever of unknown origin.  Left upper quadrant pain.

COMPLETE ABDOMINAL ULTRASOUND

[Series 1: us abdomen complete · 0.24mm/px · 14 of 79 slices shown]
[im 1/79]
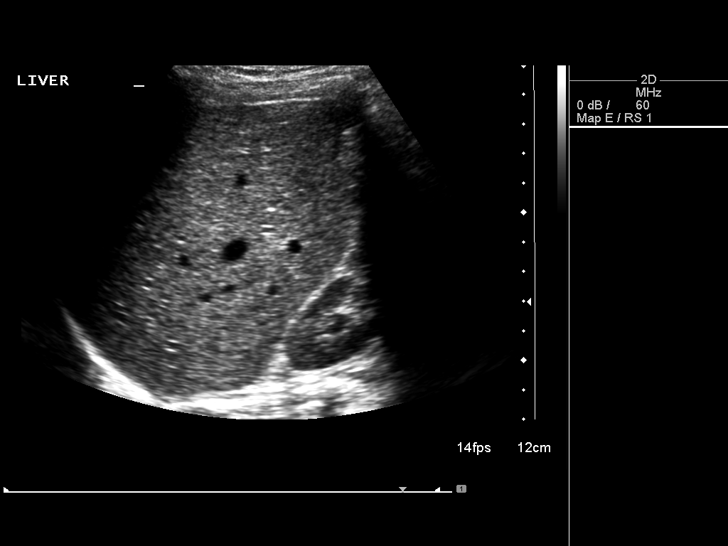
[im 7/79]
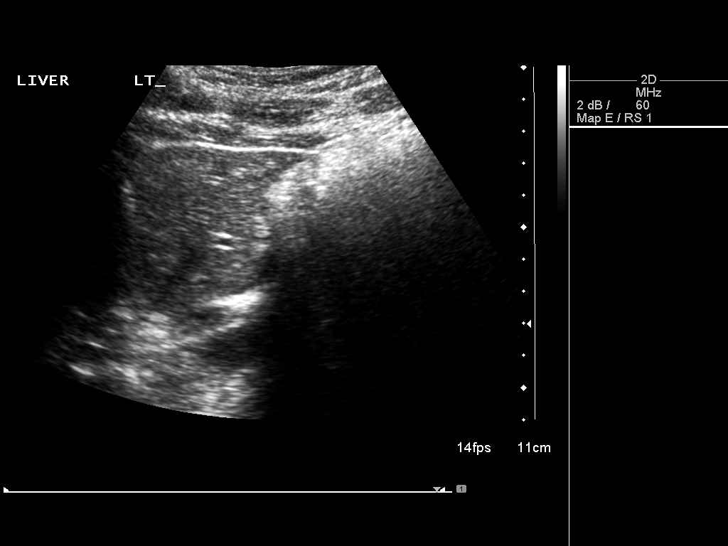
[im 14/79]
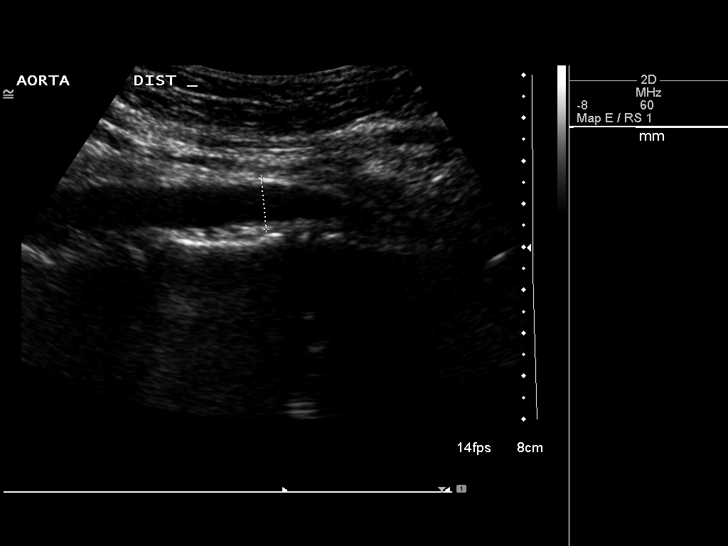
[im 20/79]
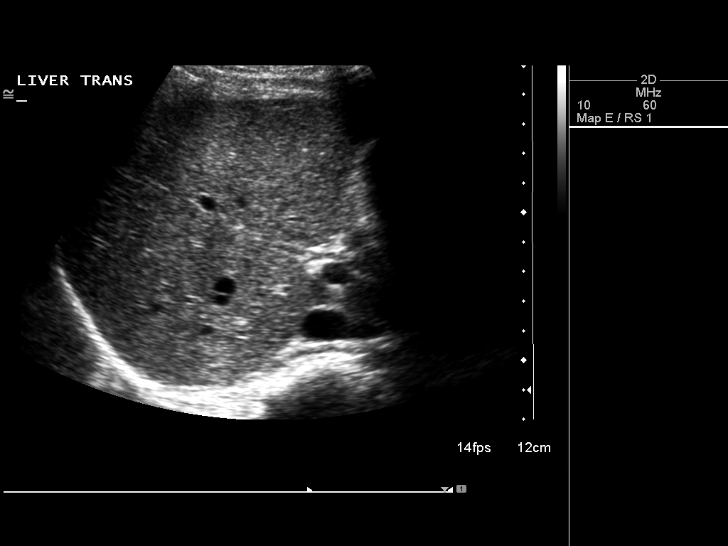
[im 27/79]
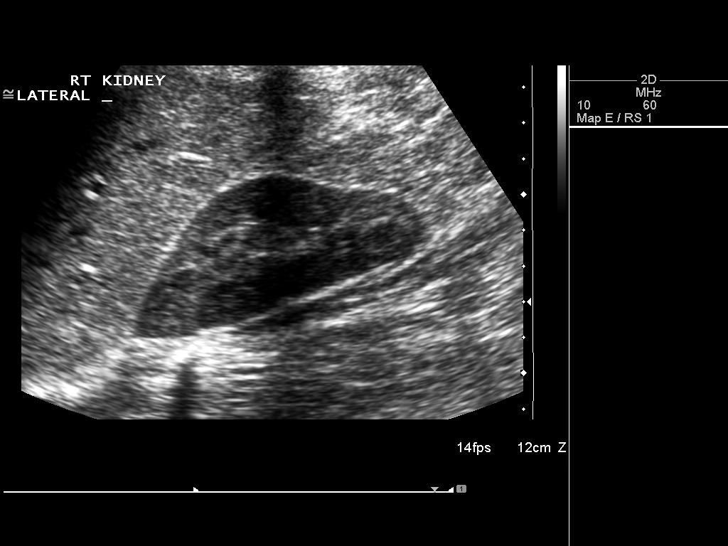
[im 30/79]
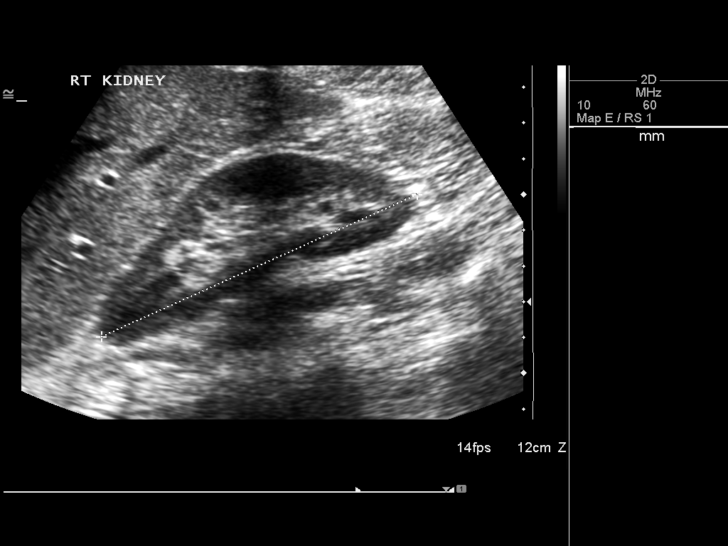
[im 36/79]
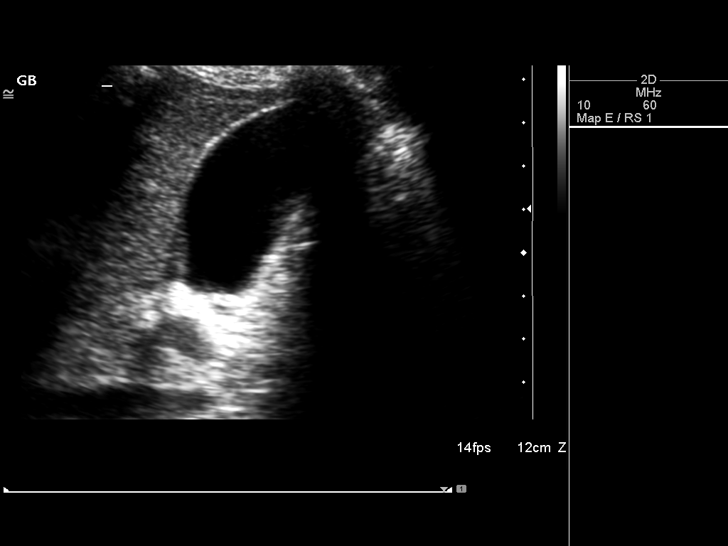
[im 43/79]
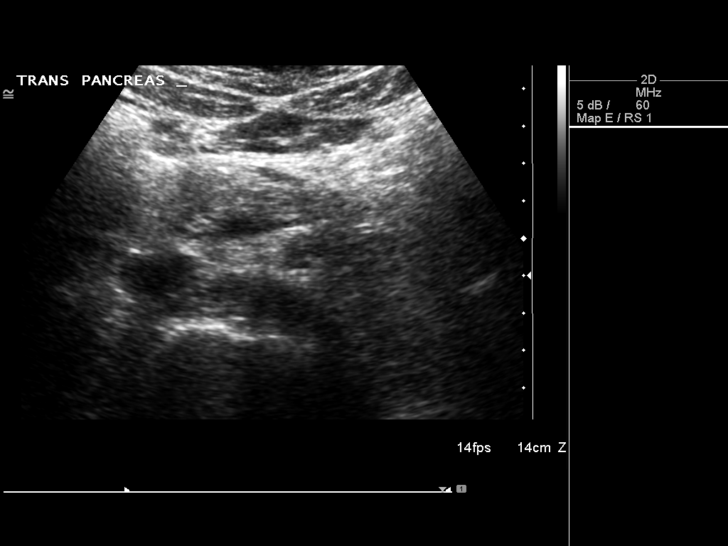
[im 49/79]
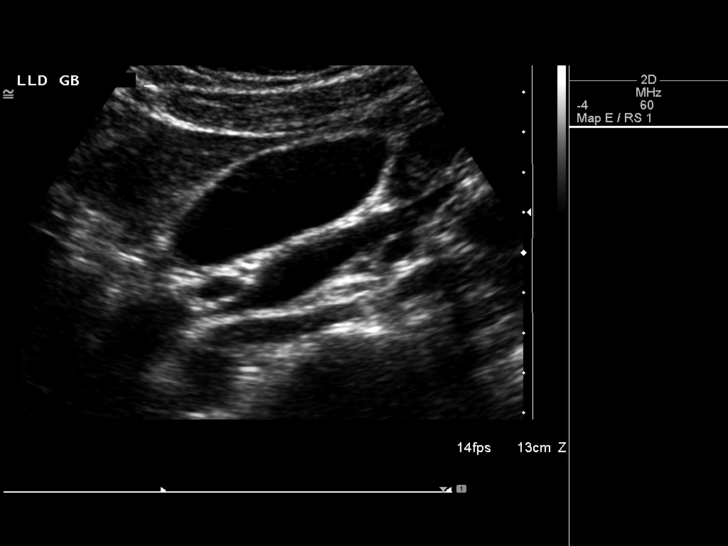
[im 53/79]
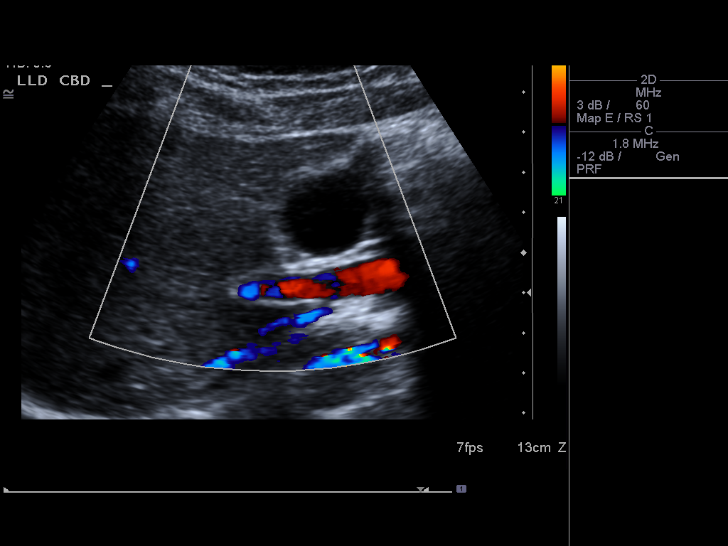
[im 59/79]
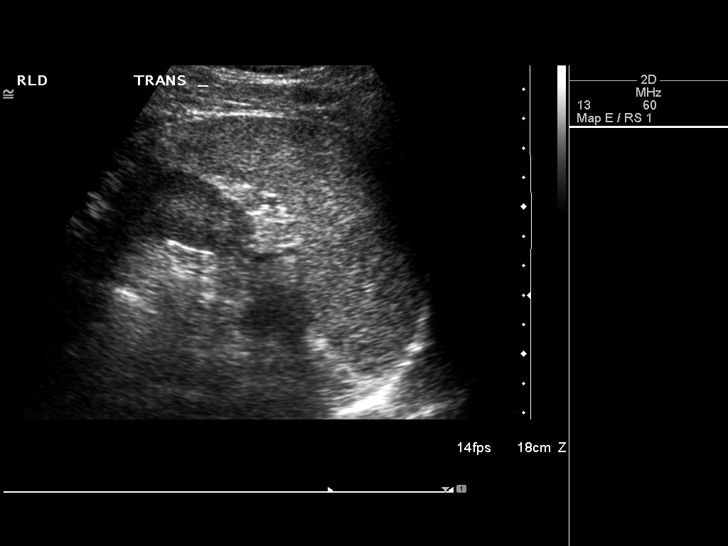
[im 66/79]
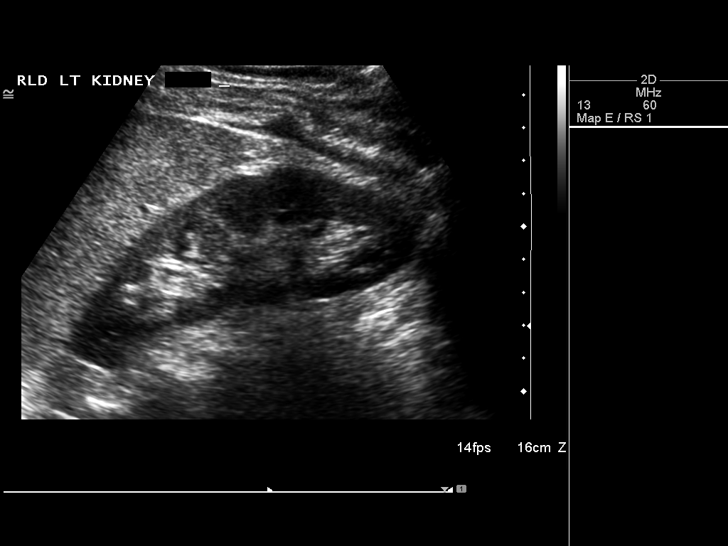
[im 72/79]
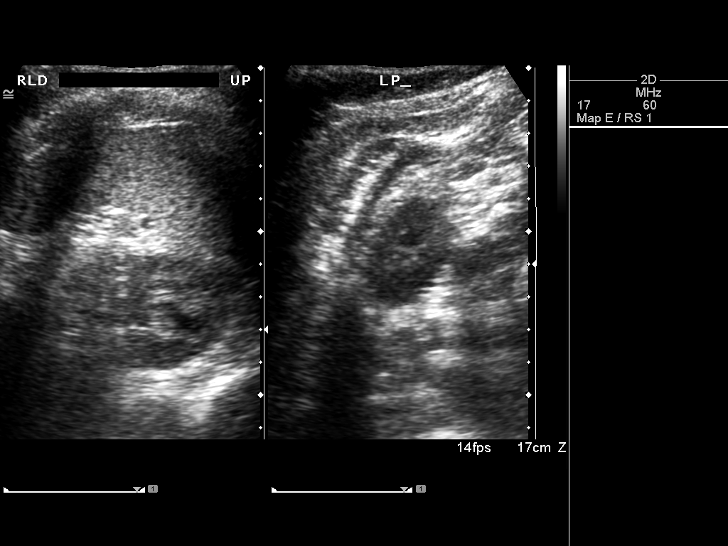
[im 79/79]
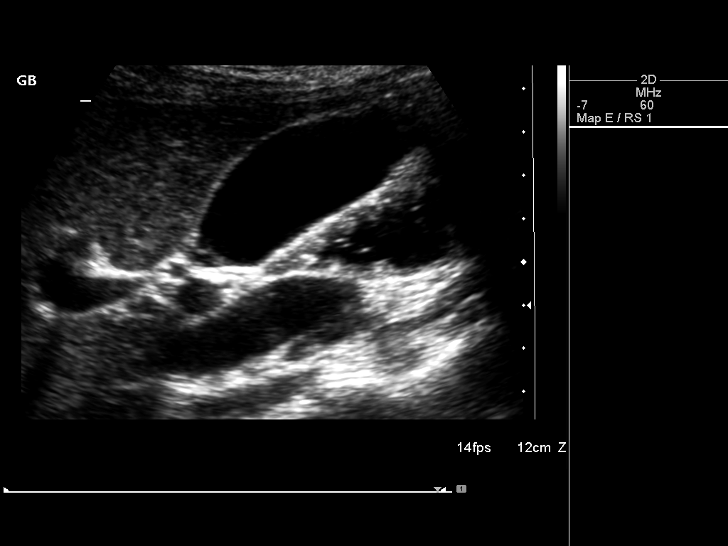

[14 of 25 positions shown; findings below may reference images not displayed]

FINDINGS: Gallbladder:  No gallstones, gallbladder wall thickening, or
pericholecystic fluid.

Common bile duct:   Normal caliber, 2 mm.

Liver:  No focal lesion identified.  Within normal limits in
parenchymal echogenicity.

IVC:  Appears normal.

Pancreas:  No focal abnormality seen.

Spleen:  Normal size and echotexture.  Craniocaudal length is
cm.  Adjacent 8 mm accessory spleen.

Right Kidney:   Normal in size and parenchymal echogenicity.  No
evidence of mass or hydronephrosis.

Left Kidney:  Normal in size and parenchymal echogenicity.  No
evidence of mass or hydronephrosis.

Abdominal aorta:  No aneurysm identified.
IMPRESSION: Negative abdominal ultrasound.

## 2014-09-28 IMAGING — CT CT CHEST W/ CM
2 of 4 series · 17 of 46 positions shown, 19 images · IV contrast (READICAT/WATER & [ID] OMNI 300)
Comparison: None.

CT CHEST

CLINICAL DATA: LUQ abdominal pain, fatigue, swollen glands, fever,
splenomegaly

CT CHEST, ABDOMEN AND PELVIS WITH CONTRAST
TECHNIQUE: Multidetector CT imaging of the chest, abdomen and
pelvis was performed following the standard protocol during bolus
administration of intravenous contrast.
Contrast: 100mL OMNIPAQUE IOHEXOL 300 MG/ML  SOLN

[Series 2: chest/abd/pelvis · axial · 0.64mm/px · z∈[-534,-24]mm · 14 of 114 slices shown, 16 images]
[im 6/114  soft-tissue]
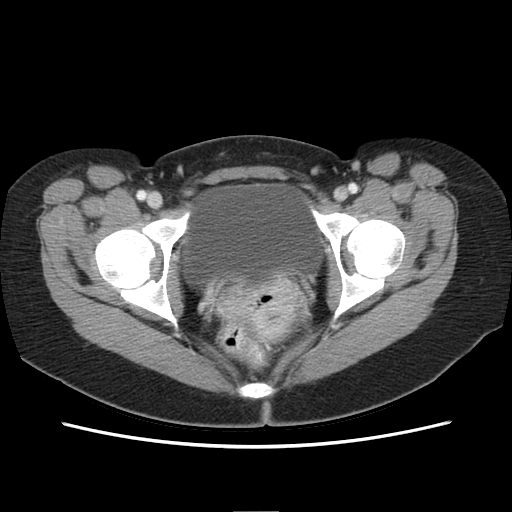
[im 6/114  bone]
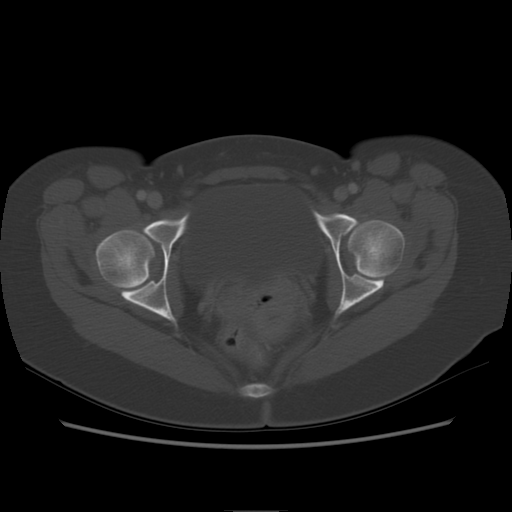
[im 17/114  soft-tissue]
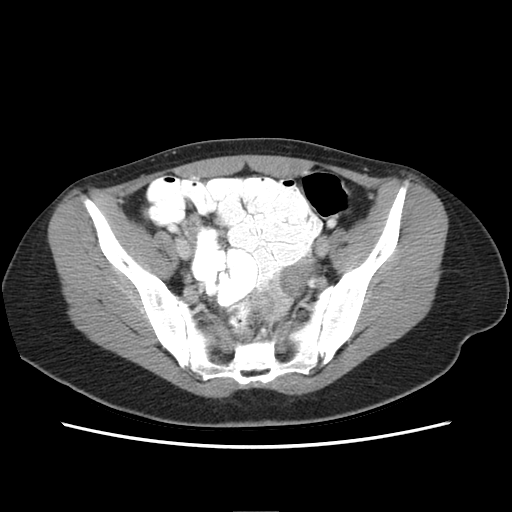
[im 23/114  soft-tissue]
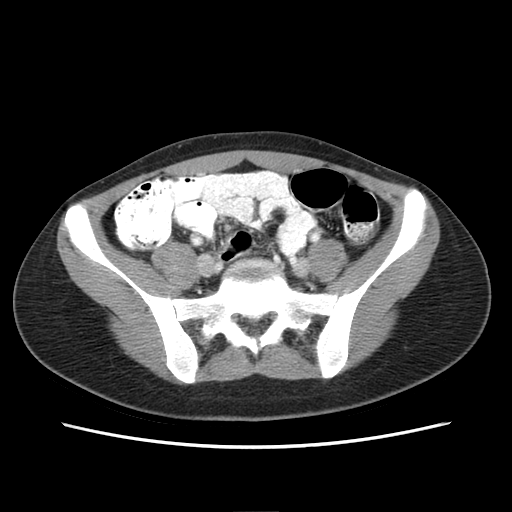
[im 29/114  soft-tissue]
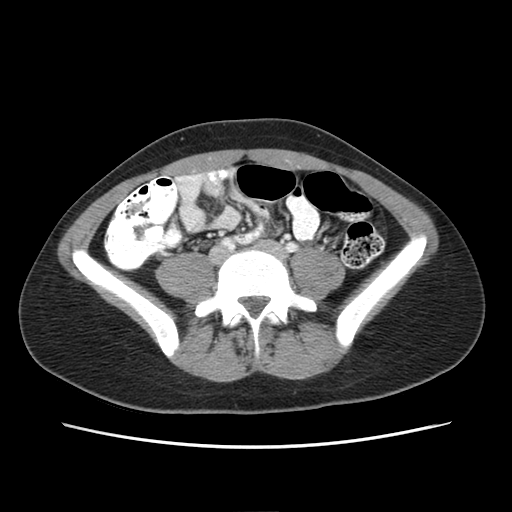
[im 40/114  soft-tissue]
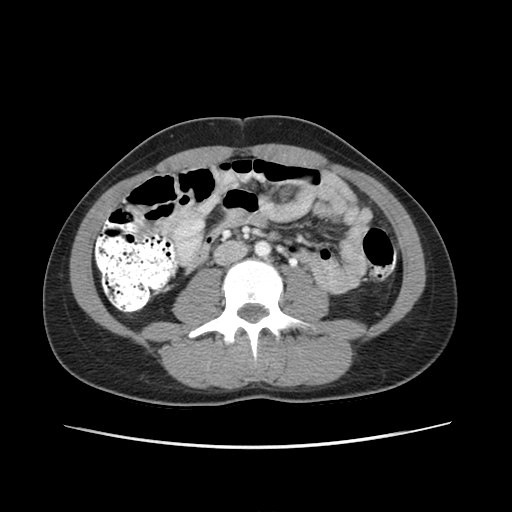
[im 46/114  soft-tissue]
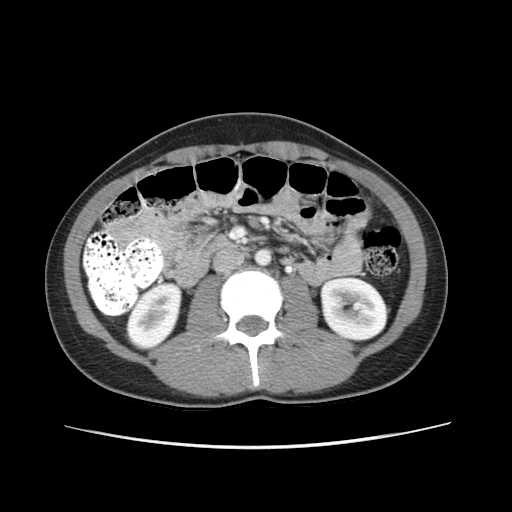
[im 51/114  soft-tissue]
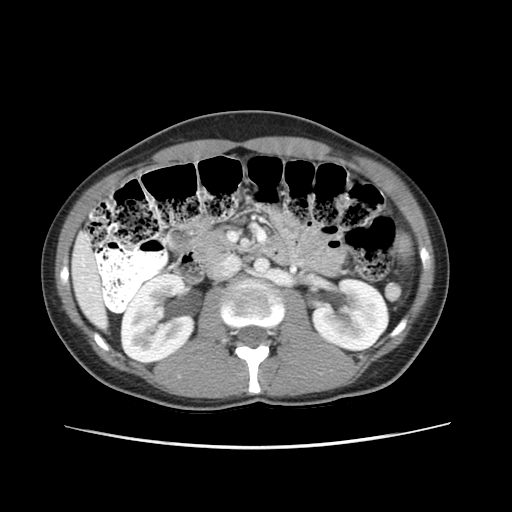
[im 63/114  soft-tissue]
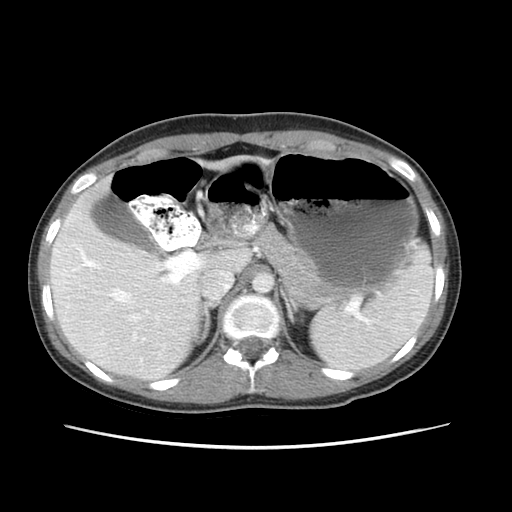
[im 68/114  soft-tissue]
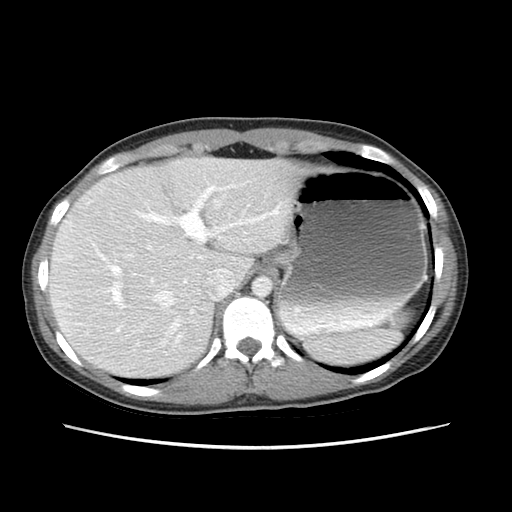
[im 68/114  bone]
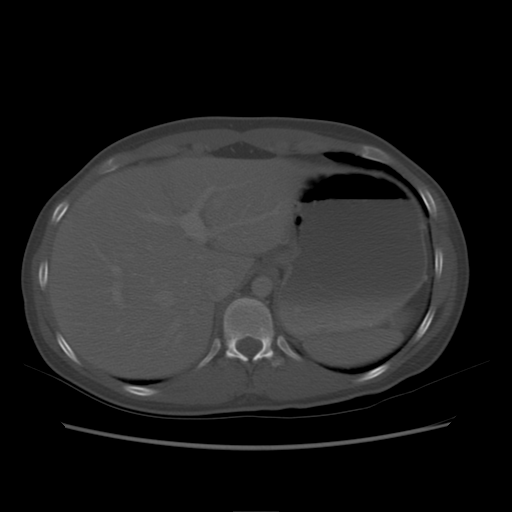
[im 74/114  soft-tissue]
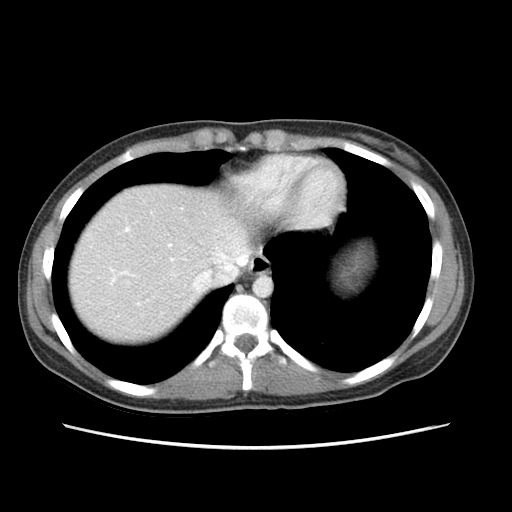
[im 85/114  soft-tissue]
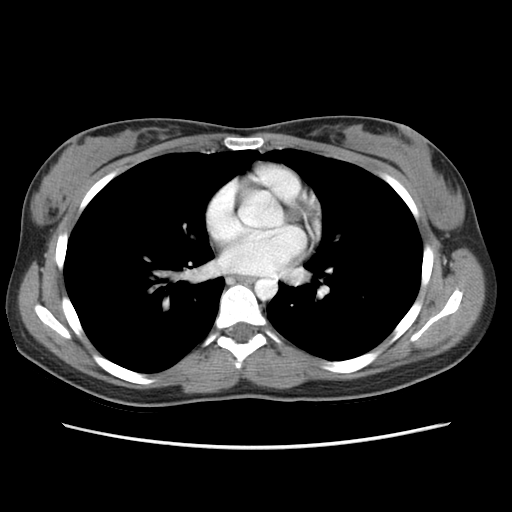
[im 91/114  soft-tissue]
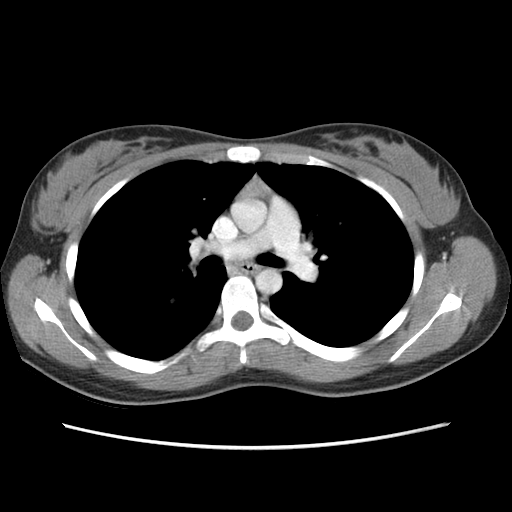
[im 97/114  soft-tissue]
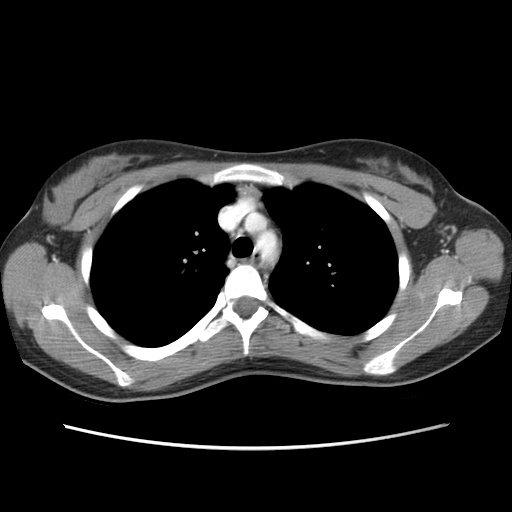
[im 108/114  soft-tissue]
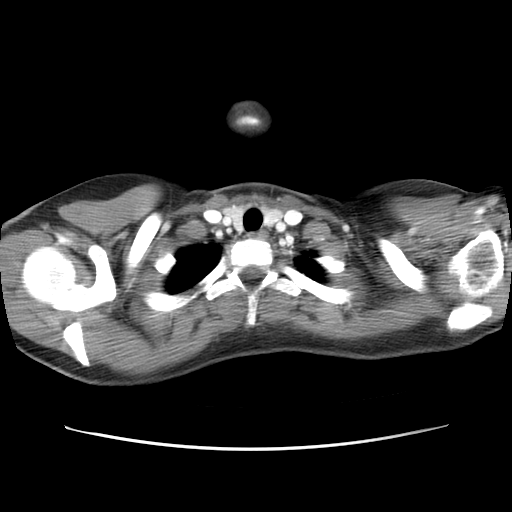

[Series 400: cor · coronal · 1.26mm/px · 3 of 111 slices shown]
[im 37/111  soft-tissue]
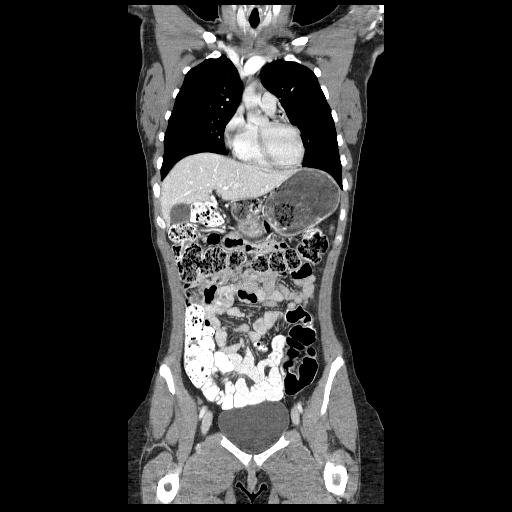
[im 49/111  soft-tissue]
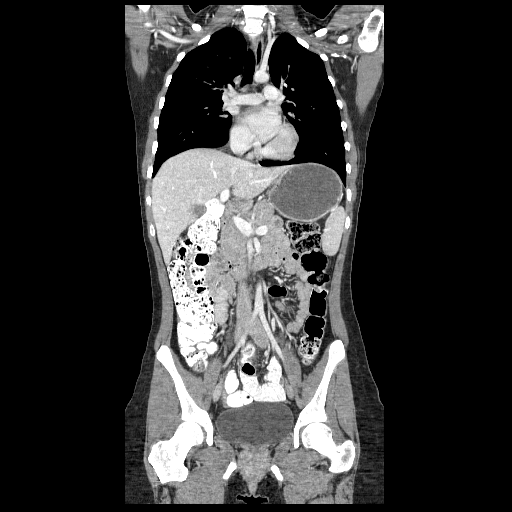
[im 62/111  soft-tissue]
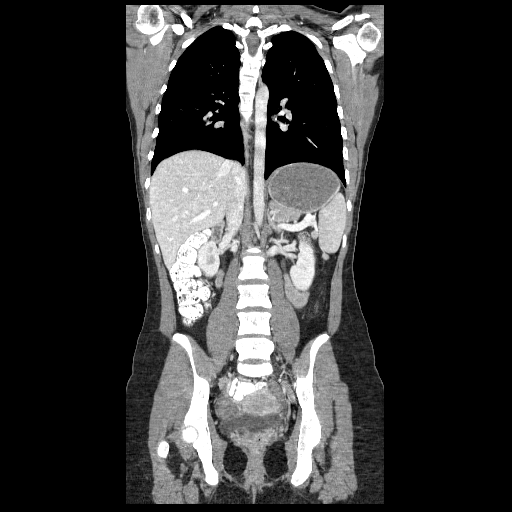

[17 of 46 positions shown; findings below may reference images not displayed]

FINDINGS: The lungs are clear. No pleural effusion or
pneumothorax.

Visualized thyroid is unremarkable.

Residual thymic tissue in the anterior mediastinum.

Heart is normal in size.  No pericardial effusion.

No suspicious mediastinal, hilar, or axillary lymphadenopathy.

Visualized osseous structures are within normal limits.
IMPRESSION: Normal CT chest.

No suspicious thoracic lymphadenopathy.

CT ABDOMEN AND PELVIS
FINDINGS: Liver, pancreas, and adrenal glands are within normal
limits.

Spleen is normal in size, measuring 9.9 cm in maximal dimension.

Gallbladder is unremarkable.  No intrahepatic or extrahepatic
ductal dilatation.

Kidneys are within normal limits.  No hydronephrosis.

No evidence of bowel obstruction.  Normal appendix.  No bowel wall
thickening or inflammatory changes.

No evidence of abdominal aortic aneurysm.  Circumaortic left renal
vein.

No abdominopelvic ascites.

No suspicious abdominopelvic lymphadenopathy.

Uterus and bilateral ovaries are unremarkable, noting a 1.8 cm
right corpus luteal cyst.

Bladder is within normal limits.

Visualized osseous structures are within normal limits.
IMPRESSION: Normal CT abdomen/pelvis.

Spleen is normal in size.

No suspicious abdominopelvic lymphadenopathy.
